# Patient Record
Sex: Male | Born: 1980
Health system: Southern US, Community
[De-identification: ages and names within clinical notes are randomized; demographics above are authoritative.]

---
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
## 2012-01-01 ENCOUNTER — Emergency Department: Payer: Self-pay | Admitting: *Deleted

## 2020-01-01 ENCOUNTER — Other Ambulatory Visit: Payer: Medicaid Other

## 2020-01-01 ENCOUNTER — Other Ambulatory Visit: Payer: Self-pay

## 2020-01-01 DIAGNOSIS — Z20822 Contact with and (suspected) exposure to covid-19: Secondary | ICD-10-CM

## 2020-01-02 LAB — NOVEL CORONAVIRUS, NAA: SARS-CoV-2, NAA: NOT DETECTED

## 2020-01-02 LAB — SARS-COV-2, NAA 2 DAY TAT

## 2020-04-28 ENCOUNTER — Ambulatory Visit (INDEPENDENT_AMBULATORY_CARE_PROVIDER_SITE_OTHER): Payer: Self-pay

## 2020-04-28 ENCOUNTER — Other Ambulatory Visit: Payer: Self-pay

## 2020-04-28 ENCOUNTER — Ambulatory Visit
Admission: EM | Admit: 2020-04-28 | Discharge: 2020-04-28 | Disposition: A | Discharge status: Home / Self Care | Payer: Self-pay | Attending: Internal Medicine | Admitting: Internal Medicine

## 2020-04-28 DIAGNOSIS — K219 Gastro-esophageal reflux disease without esophagitis: Secondary | ICD-10-CM

## 2020-04-28 DIAGNOSIS — F1490 Cocaine use, unspecified, uncomplicated: Secondary | ICD-10-CM

## 2020-04-28 DIAGNOSIS — R0789 Other chest pain: Secondary | ICD-10-CM

## 2020-04-28 DIAGNOSIS — R0602 Shortness of breath: Secondary | ICD-10-CM

## 2020-04-28 DIAGNOSIS — R079 Chest pain, unspecified: Secondary | ICD-10-CM

## 2020-04-28 MED ORDER — OMEPRAZOLE 20 MG PO CPDR: 20.0000 mg | DELAYED_RELEASE_CAPSULE | Freq: Every day | ORAL | 0 refills | Status: AC

## 2020-04-28 NOTE — Discharge Instructions (Addendum)
Your chest xray and EKG are normal. If the chest pain comes back, go to the ER while is happening but call EMS to take you.  Call and ask to get established with a primary care provider. Continue staying off Cocaine and join a support group to help you stay clean.  Since you are a [place], you can check  [facility].  [employer] [phone] mi) [address] [place], [place] [address] Darden Amber [phone] Category: Group [facility] Contact: Joe "Bulldog" Edwards Meeting Time: Monday 6:00 PM  [employer] (13.7 mi) [address], [place], [place] [address] Darden Amber [phone] Category: Group [facility] Contact: Rob Laurine Blazer  [facility]. [address], [place], [place] [address] [phone]

## 2020-04-28 NOTE — ED Provider Notes (Signed)
[facility]    CSN: [id number] Arrival date & time: 04/28/20  1647      History   Chief Complaint Chief Complaint  Patient presents with  . Shortness of Breath    HPI Caleb Crawford is a 39 y.o. male who presents due to having SOB off and on x for several months that comes and goes.  Yesterday he had  SOB and R arm pain and his L chest felt pressure and pain, and felt his heart fluttered. This lasted a few seconds. He stayed SOB after this episodes, but continued working. He has been working physically when this happened.  He started using cocaine again Jan this year 1-2 times a week ( snorting it) but the last time he used it was 2-3 weeks ago, and trying to stay away from old friends whom  he used to use it with, but does not go to a support group.  2- Has been getting a lot of heart burn and he tends to snack before bed time.    History reviewed. No pertinent past medical history.  There are no problems to display for this patient.   Past Surgical History:  Procedure Laterality Date  . PILONIDAL CYST EXCISION  2000       Home Medications    Prior to Admission medications   Medication Sig Start Date End Date Taking? Authorizing Provider  omeprazole (PRILOSEC) 20 MG capsule Take 1 capsule (20 mg total) by mouth daily. 30 min before breakfast 04/28/20   Rodriguez-Southworth, Nettie Elm, PA-C    Family History History reviewed. No pertinent family history.  Social History Social History   Tobacco Use  . Smoking status: Current Every Day Smoker    Packs/day: 2.00    Types: Cigarettes  . Smokeless tobacco: Never Used  Vaping Use  . Vaping Use: Never used  Substance Use Topics  . Alcohol use: Yes    Comment: occasionally  . Drug use: Yes    Types: Marijuana, Cocaine     Allergies   Amoxicillin   Review of Systems Review of Systems  Constitutional: Negative for activity change, appetite change, chills, diaphoresis, fatigue and fever.  HENT:  Positive for congestion. Negative for ear discharge, ear pain, postnasal drip, rhinorrhea, sore throat and voice change.   Eyes: Negative for discharge.  Respiratory: Positive for cough, chest tightness and shortness of breath.   Gastrointestinal: Positive for constipation. Negative for abdominal pain, diarrhea, nausea and vomiting.       + GERD  Musculoskeletal: Positive for back pain. Negative for gait problem.  Skin: Negative for rash.  Neurological: Negative for dizziness, syncope, weakness, numbness and headaches.     Physical Exam Triage Vital Signs ED Triage Vitals  Enc Vitals Group     BP 04/28/20 1726 136/77     Pulse Rate 04/28/20 1726 89     Resp 04/28/20 1726 (!) 22     Temp 04/28/20 1726 98.2 F (36.8 C)     Temp Source 04/28/20 1726 Oral     SpO2 04/28/20 1726 100 %     Weight 04/28/20 1723 170 lb (77.1 kg)     Height 04/28/20 1723 5\' 11"  (1.803 m)     Head Circumference --      Peak Flow --      Pain Score 04/28/20 1723 0     Pain Loc --      Pain Edu? --      Excl. in GC? --  No data found.  Updated Vital Signs BP 136/77 (BP Location: Right Arm)   Pulse 89   Temp 98.2 F (36.8 C) (Oral)   Resp (!) 22   Ht 5\' 11"  (1.803 m)   Wt 170 lb (77.1 kg)   SpO2 100%   BMI 23.71 kg/m   Visual Acuity Right Eye Distance:   Left Eye Distance:   Bilateral Distance:    Right Eye Near:   Left Eye Near:    Bilateral Near:     Physical Exam Physical Exam Vitals signs and nursing note reviewed.  Constitutional:      General: he is not in acute distress.    Appearance: Normal appearance. he is not ill-appearing, toxic-appearing or diaphoretic.  HENT:     Head: Normocephalic.     Right Ear: Tympanic membrane, ear canal and external ear normal.     Left Ear: Tympanic membrane, ear canal and external ear normal.     Nose: Nose normal.     Mouth/Throat:     Mouth: Mucous membranes are moist.  Eyes:     General: No scleral icterus.       Right eye: No  discharge.        Left eye: No discharge.     Conjunctiva/sclera: Conjunctivae normal.  Neck:     Musculoskeletal: Neck supple. No neck rigidity.  Cardiovascular:     Rate and Rhythm: Normal rate and regular rhythm.     Heart sounds: No murmur.  Pulmonary:     Effort: Pulmonary effort is normal. Does not have chest wall tenderness.     Breath sounds: Normal breath sounds.   Musculoskeletal: Normal range of motion.  Lymphadenopathy:     Cervical: No cervical adenopathy.  Skin:    General: Skin is warm and dry.     Coloration: Skin is not jaundiced.     Findings: No rash.  Neurological:     Mental Status: he is alert and oriented to person, place, and time.     Gait: Gait normal.  Psychiatric:        Mood and Affect: Mood normal.        Behavior: Behavior normal.        Thought Content: Thought content normal.        Judgment: Judgment normal.   UC Treatments / Results  Labs (all labs ordered are listed, but only abnormal results are displayed) Labs Reviewed - No data to display  EKG Normal sinus rhythm  Radiology DG Chest 2 View  Result Date: 04/28/2020 CLINICAL DATA:  Chest pain and shortness of breath. EXAM: CHEST - 2 VIEW COMPARISON:  Chest x-ray dated February 02, 2012. FINDINGS: The heart size and mediastinal contours are within normal limits. Both lungs are clear. The visualized skeletal structures are unremarkable. IMPRESSION: No active cardiopulmonary disease. Electronically Signed   By: February 04, 2012 M.D.   On: 04/28/2020 18:34    Procedures Procedures (including critical care time)  Medications Ordered in UC Medications - No data to display  Initial Impression / Assessment and Plan / UC Course  I have reviewed the triage vital signs and the nursing notes. Pertinent labs & imaging results that were available during my care of the patient were reviewed by me and considered in my medical decision making (see chart for details). His chest pain could be from  GERD, but told he needs to get established with a PCP and have further work up. I placed him on Prilosec, and  encouraged him to d/c cocaine and seek support. See instructions.  Final Clinical Impressions(s) / UC Diagnoses   Final diagnoses:  SOB (shortness of breath)  Cocaine use  Other chest pain  Gastroesophageal reflux disease without esophagitis     Discharge Instructions     Your chest xray and EKG are normal. If the chest pain comes back, go to the ER while is happening but call EMS to take you.  Call and ask to get established with a primary care provider. Continue staying off Cocaine and join a support group to help you stay clean.  Since you are a [place], you can check  [facility].  [employer] [phone] mi) [address] [place], [place] [address] Darden Amber [phone] Category: Group Celebrate Recovery Contact: Joe "Bulldog" Edwards Meeting Time: Monday 6:00 PM  [employer] (13.7 mi) [address] [place], [place] [address] Darden Amber [phone] Category: Group [facility] Contact: Rob Laurine Blazer  [facility]. [address] [place], [place] [address] [phone]     ED Prescriptions    Medication Sig Dispense Auth. Provider   omeprazole (PRILOSEC) 20 MG capsule Take 1 capsule (20 mg total) by mouth daily. 30 min before breakfast 30 capsule Rodriguez-Southworth, Nettie Elm, PA-C     PDMP not reviewed this encounter.   Garey Ham, Cordelia Poche 04/28/20 1924

## 2020-04-28 NOTE — ED Triage Notes (Signed)
Patient states that he has been dealing with shortness of breath. Patient states that yesterday he had some arm pain. Patient states that he has been using cocaine recently (last use 2-3 weeks ago) he is concerned this collapsed one of his nasal pathways. Patient states that he has been having acid reflux. Patient states that he has been having some chest pain but is concerned about cancer or DDD.

## 2021-11-19 ENCOUNTER — Emergency Department
Admission: EM | Admit: 2021-11-19 | Discharge: 2021-11-19 | Payer: Medicaid Other | Attending: Emergency Medicine | Admitting: Emergency Medicine

## 2021-11-19 DIAGNOSIS — F1510 Other stimulant abuse, uncomplicated: Secondary | ICD-10-CM

## 2021-11-19 DIAGNOSIS — F1590 Other stimulant use, unspecified, uncomplicated: Secondary | ICD-10-CM | POA: Insufficient documentation

## 2021-11-19 NOTE — ED Notes (Signed)
E-signature pad unavailable - Pt verbalized understanding of D/C information - no additional concerns at this time.  

## 2021-11-19 NOTE — ED Provider Notes (Signed)
[facility] Provider Note    None    (approximate)   History   Medical Clearance   HPI  Caleb Crawford is a 41 y.o. male with history of methamphetamine abuse who presents to the emergency department in police custody for medical clearance for jail.  Patient has no complaints at this time.  Used methamphetamine just prior to being arrested and now is very sleepy but arousable.  He denies any other coingestions.  No opiate or alcohol use.  No thoughts of wanting to harm himself or anyone else.  Denies any pain.   History provided by patient and police.    No past medical history on file.  Past Surgical History:  Procedure Laterality Date   PILONIDAL CYST EXCISION  2000    MEDICATIONS:  Prior to Admission medications   Medication Sig Start Date End Date Taking? Authorizing Provider  omeprazole (PRILOSEC) 20 MG capsule Take 1 capsule (20 mg total) by mouth daily. 30 min before breakfast 04/28/20   Rodriguez-SouthworthNettie Elm, [place]    Physical Exam   Triage Vital Signs: ED Triage Vitals [11/19/21 0437]  Enc Vitals Group     BP 114/78     Pulse Rate 78     Resp 16     Temp 98.4 F (36.9 C)     Temp Source Oral     SpO2 99 %     Weight      Height      Head Circumference      Peak Flow      Pain Score 0     Pain Loc      Pain Edu?      Excl. in GC?     Most recent vital signs: Vitals:   11/19/21 0437  BP: 114/78  Pulse: 78  Resp: 16  Temp: 98.4 F (36.9 C)  SpO2: 99%    CONSTITUTIONAL: Alert and oriented and responds appropriately to questions.  Chronically ill-appearing.  Drowsy but arousable to voice. HEAD: Normocephalic, atraumatic EYES: Conjunctivae clear, pupils appear equal, sclera nonicteric ENT: normal nose; moist mucous membranes NECK: Supple, normal ROM CARD: RRR; S1 and S2 appreciated; no murmurs, no clicks, no rubs, no gallops RESP: Normal chest excursion without splinting or tachypnea; breath sounds clear and  equal bilaterally; no wheezes, no rhonchi, no rales, no hypoxia or respiratory distress, speaking full sentences ABD/GI: Normal bowel sounds; non-distended; soft, non-tender, no rebound, no guarding, no peritoneal signs BACK: The back appears normal EXT: Normal ROM in all joints; no deformity noted, no edema; no cyanosis SKIN: Normal color for age and race; warm; no rash on exposed skin NEURO: Moves all extremities equally, normal speech PSYCH: The patient's mood and manner are appropriate.  Denies SI or HI.   ED Results / Procedures / Treatments   LABS: (all labs ordered are listed, but only abnormal results are displayed) Labs Reviewed - No data to display   EKG:   RADIOLOGY: My personal review and interpretation of imaging:    I have personally reviewed all radiology reports.   No results found.   PROCEDURES:  Critical Care performed: No      Procedures    IMPRESSION / MDM / ASSESSMENT AND PLAN / ED COURSE  I reviewed the triage vital signs and the nursing notes.    Patient here for clearance for jail.  Has no acute complaints.     DIFFERENTIAL DIAGNOSIS (includes but not limited to):   Acute intoxication,  no medical complaints, no psychiatric complaints   Patient's presentation is most consistent with exacerbation of chronic illness.   PLAN: Patient here with history of substance abuse with drowsiness and [employer] department requesting medical clearance before going back to jail.  Patient has no complaints at this time.  Easily arousable to voice.  Monitored for several minutes on pulse oximetry with no apnea or hypoxia.  He denies SI or HI.  Denies any other coingestions.  Denies any pain.  Will discharge in police custody.   MEDICATIONS GIVEN IN ED: Medications - No data to display   ED COURSE:  At this time, I do not feel there is any life-threatening condition present. I reviewed all nursing notes, vitals, pertinent previous records.  All lab and  urine results, EKGs, imaging ordered have been independently reviewed and interpreted by myself.  I reviewed all available radiology reports from any imaging ordered this visit.  Based on my assessment, I feel the patient is safe to be discharged home without further emergent workup and can continue workup as an outpatient as needed. Discussed all findings, treatment plan as well as usual and customary return precautions.  They verbalize understanding and are comfortable with this plan.  Outpatient follow-up has been provided as needed.  All questions have been answered.    CONSULTS:  none   OUTSIDE RECORDS REVIEWED: No old records for review.       FINAL CLINICAL IMPRESSION(S) / ED DIAGNOSES   Final diagnoses:  Methamphetamine use (HCC)     Rx / DC Orders   ED Discharge Orders     None        Note:  This document was prepared using Dragon voice recognition software and may include unintentional dictation errors.   Derk Doubek, Layla Maw, DO 11/19/21 [phone]

## 2021-11-19 NOTE — Discharge Instructions (Signed)
Steps to find a Primary Care Provider (PCP):  Call [phone] or [phone] to access "[facility] Find a Doctor Service."  2.  You may also go on the [facility] website at [email or web address]  

## 2021-11-19 NOTE — ED Triage Notes (Signed)
Pt brought in for medical clearance. Pt states he ingested meth 2 days ago and has not slept since. Pt is in custody. Denies any pain at this time.

## 2022-04-21 DIAGNOSIS — F1910 Other psychoactive substance abuse, uncomplicated: Secondary | ICD-10-CM | POA: Diagnosis not present

## 2022-04-21 DIAGNOSIS — F419 Anxiety disorder, unspecified: Secondary | ICD-10-CM | POA: Diagnosis not present

## 2022-04-21 DIAGNOSIS — Z9189 Other specified personal risk factors, not elsewhere classified: Secondary | ICD-10-CM | POA: Diagnosis not present

## 2022-04-21 DIAGNOSIS — Z1159 Encounter for screening for other viral diseases: Secondary | ICD-10-CM | POA: Diagnosis not present

## 2022-05-13 DIAGNOSIS — Z79899 Other long term (current) drug therapy: Secondary | ICD-10-CM | POA: Diagnosis not present

## 2022-05-18 DIAGNOSIS — Z79899 Other long term (current) drug therapy: Secondary | ICD-10-CM | POA: Diagnosis not present

## 2022-05-30 IMAGING — CR DG CHEST 2V
3 series · 3 of 3 positions shown · non-contrast
Comparison: Chest x-ray dated February 02, 2012.

CLINICAL DATA: Chest pain and shortness of breath.

EXAM:
CHEST - 2 VIEW

[chest pa (1 of 2)]
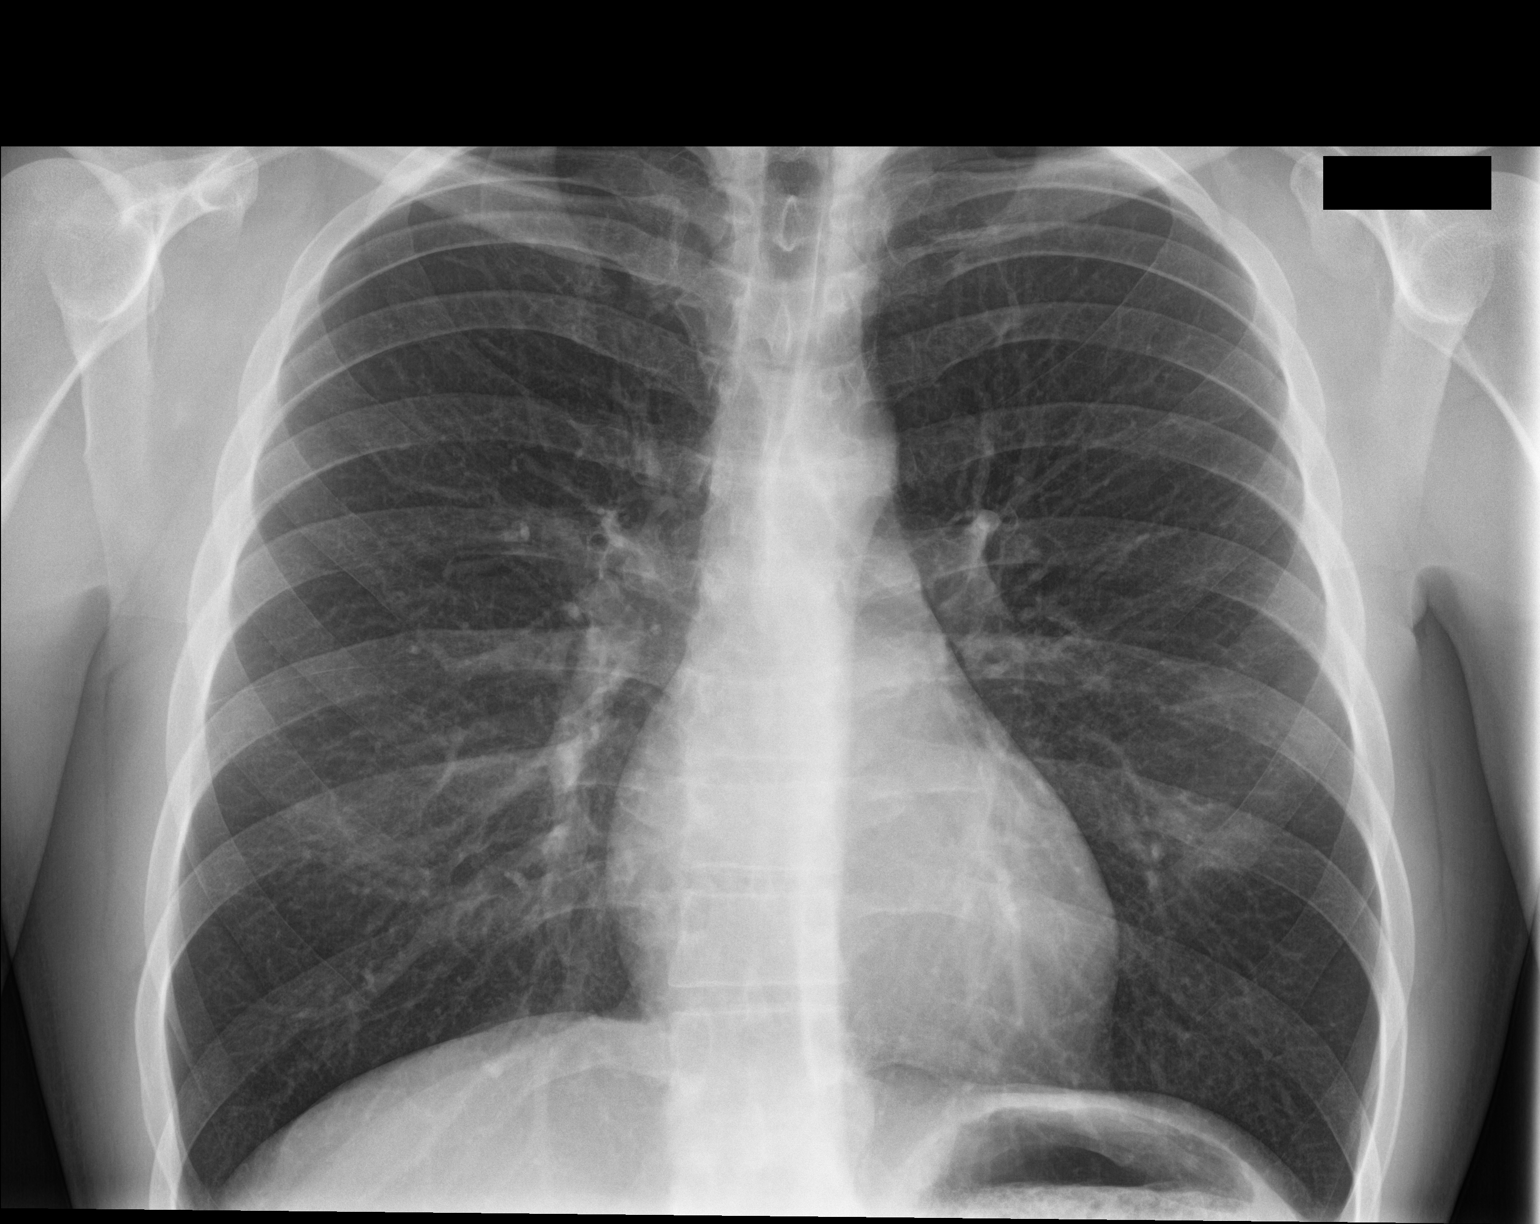

[chest pa (2 of 2)]
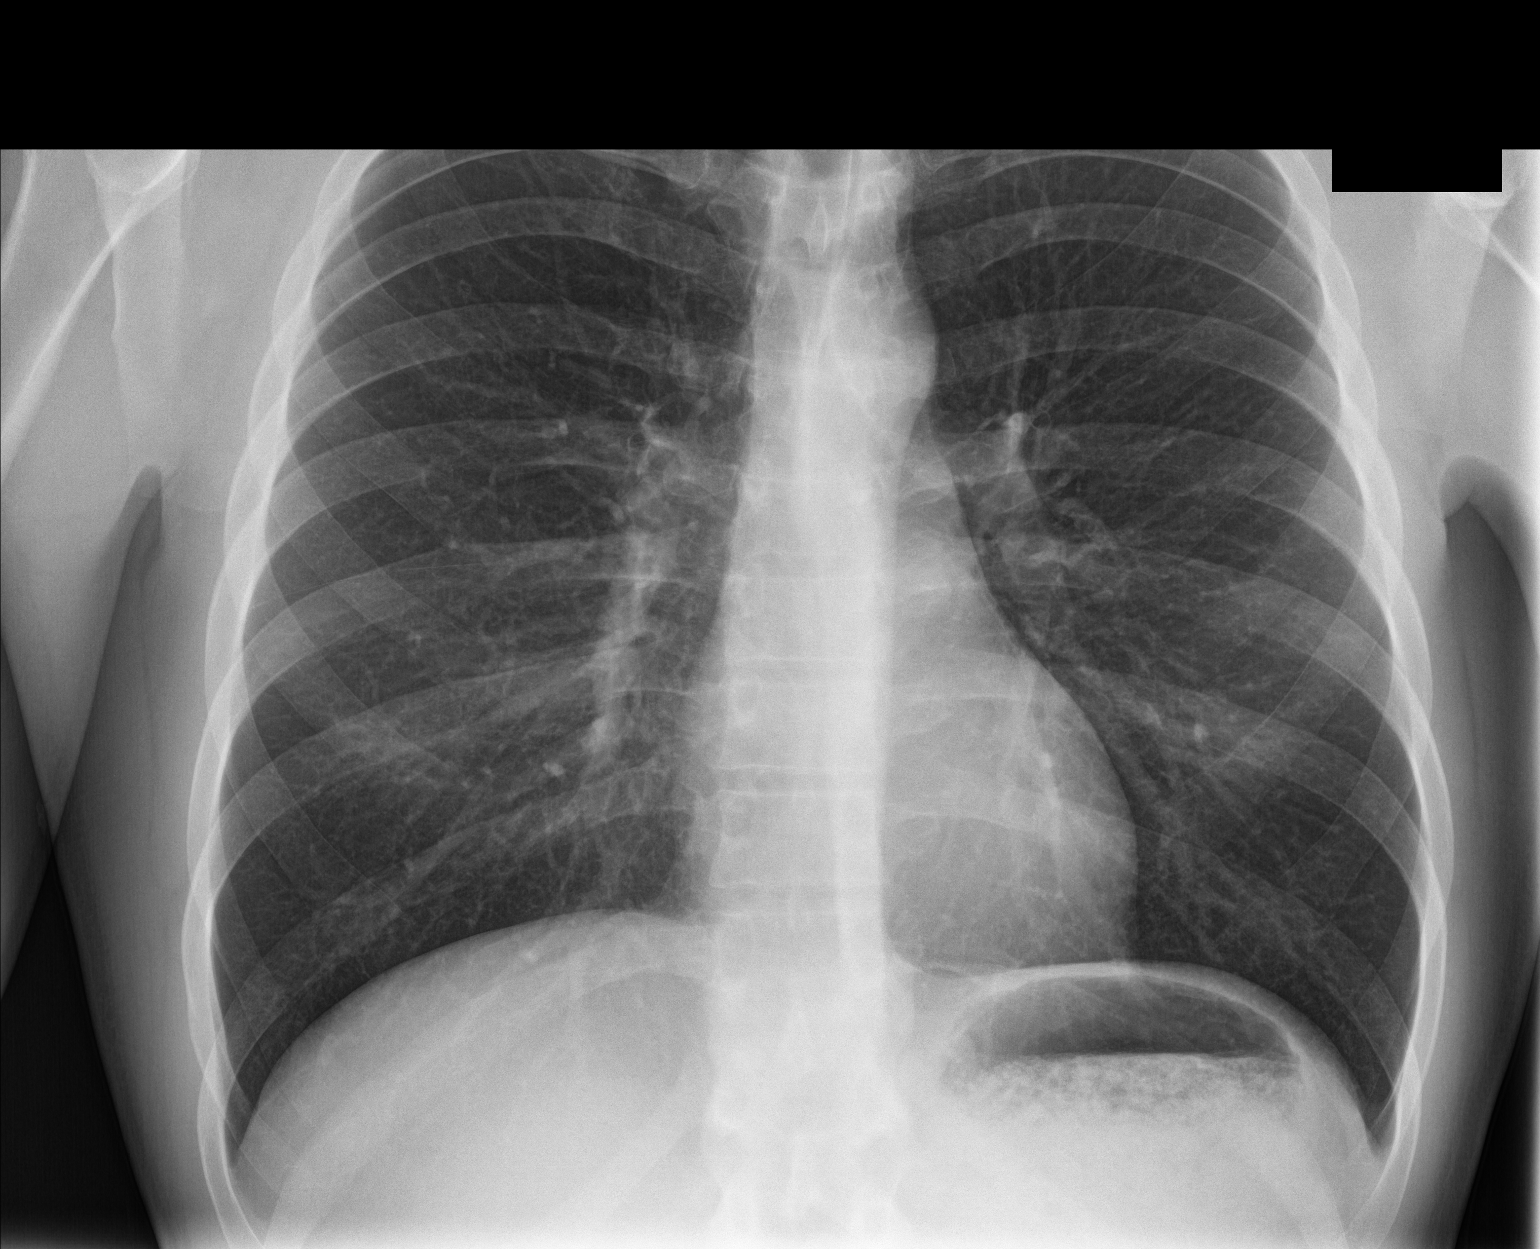

[chest lat]
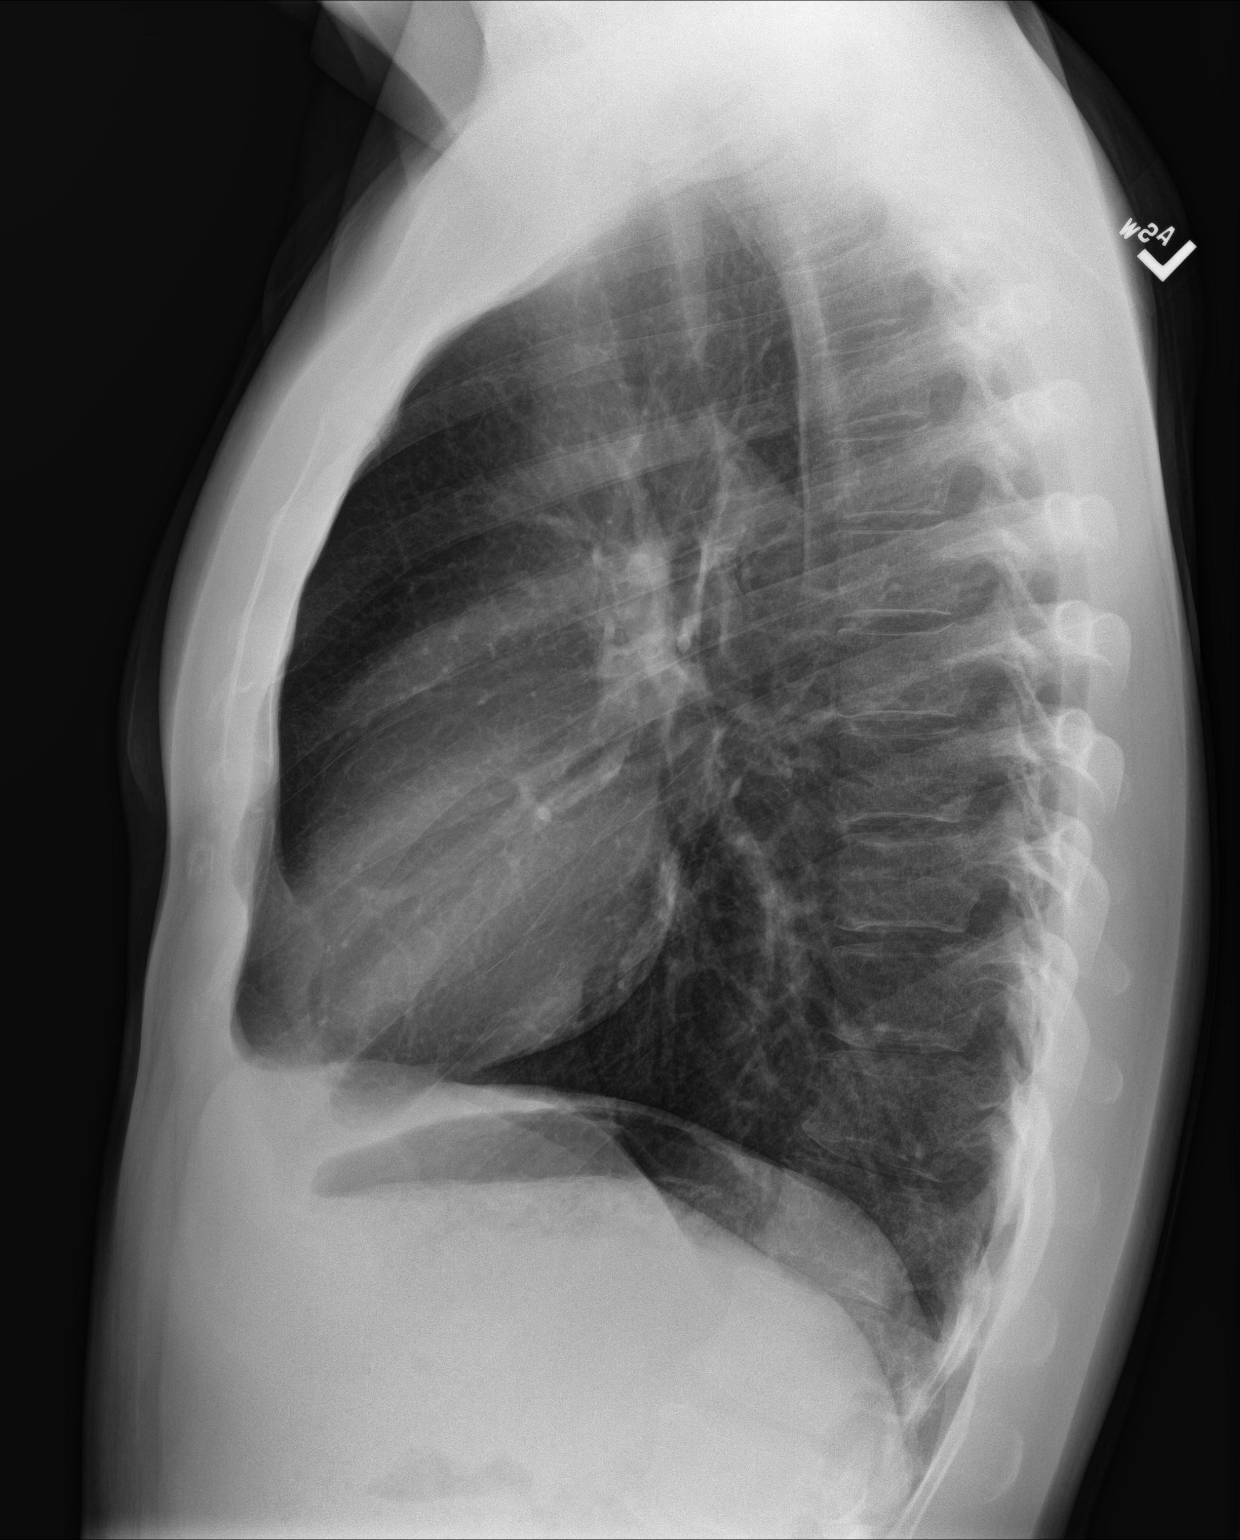

[3 of 3 positions shown; findings below may reference images not displayed]

FINDINGS: The heart size and mediastinal contours are within normal limits.
Both lungs are clear. The visualized skeletal structures are
unremarkable.
IMPRESSION: No active cardiopulmonary disease.

## 2022-06-03 DIAGNOSIS — Z79899 Other long term (current) drug therapy: Secondary | ICD-10-CM | POA: Diagnosis not present

## 2022-06-15 DIAGNOSIS — Z79899 Other long term (current) drug therapy: Secondary | ICD-10-CM | POA: Diagnosis not present

## 2022-06-24 DIAGNOSIS — Z79899 Other long term (current) drug therapy: Secondary | ICD-10-CM | POA: Diagnosis not present

## 2022-07-13 DIAGNOSIS — Z79899 Other long term (current) drug therapy: Secondary | ICD-10-CM | POA: Diagnosis not present

## 2022-07-27 DIAGNOSIS — Z79899 Other long term (current) drug therapy: Secondary | ICD-10-CM | POA: Diagnosis not present

## 2023-07-17 DIAGNOSIS — S299XXA Unspecified injury of thorax, initial encounter: Secondary | ICD-10-CM | POA: Diagnosis not present

## 2023-07-17 DIAGNOSIS — K760 Fatty (change of) liver, not elsewhere classified: Secondary | ICD-10-CM | POA: Diagnosis not present

## 2023-07-17 DIAGNOSIS — S199XXA Unspecified injury of neck, initial encounter: Secondary | ICD-10-CM | POA: Diagnosis not present

## 2023-07-17 DIAGNOSIS — S0990XA Unspecified injury of head, initial encounter: Secondary | ICD-10-CM | POA: Diagnosis not present

## 2023-07-17 DIAGNOSIS — S32401A Unspecified fracture of right acetabulum, initial encounter for closed fracture: Secondary | ICD-10-CM | POA: Diagnosis not present

## 2023-07-17 DIAGNOSIS — S0081XA Abrasion of other part of head, initial encounter: Secondary | ICD-10-CM | POA: Diagnosis not present

## 2023-07-17 DIAGNOSIS — R0602 Shortness of breath: Secondary | ICD-10-CM | POA: Diagnosis not present

## 2023-07-17 DIAGNOSIS — S32421A Displaced fracture of posterior wall of right acetabulum, initial encounter for closed fracture: Secondary | ICD-10-CM | POA: Diagnosis not present

## 2023-07-17 DIAGNOSIS — S3993XA Unspecified injury of pelvis, initial encounter: Secondary | ICD-10-CM | POA: Diagnosis not present

## 2023-07-18 DIAGNOSIS — S8991XA Unspecified injury of right lower leg, initial encounter: Secondary | ICD-10-CM | POA: Diagnosis not present

## 2023-07-18 DIAGNOSIS — S80912A Unspecified superficial injury of left knee, initial encounter: Secondary | ICD-10-CM | POA: Diagnosis not present

## 2023-07-18 DIAGNOSIS — S32421A Displaced fracture of posterior wall of right acetabulum, initial encounter for closed fracture: Secondary | ICD-10-CM | POA: Diagnosis not present

## 2023-07-21 DIAGNOSIS — S32401A Unspecified fracture of right acetabulum, initial encounter for closed fracture: Secondary | ICD-10-CM | POA: Diagnosis not present

## 2023-10-07 DIAGNOSIS — Z87891 Personal history of nicotine dependence: Secondary | ICD-10-CM | POA: Diagnosis not present

## 2023-10-07 DIAGNOSIS — R1310 Dysphagia, unspecified: Secondary | ICD-10-CM | POA: Diagnosis not present

## 2023-10-07 DIAGNOSIS — F419 Anxiety disorder, unspecified: Secondary | ICD-10-CM | POA: Diagnosis not present

## 2023-10-07 DIAGNOSIS — Z79899 Other long term (current) drug therapy: Secondary | ICD-10-CM | POA: Diagnosis not present

## 2023-10-07 DIAGNOSIS — M542 Cervicalgia: Secondary | ICD-10-CM | POA: Diagnosis not present

## 2023-10-07 DIAGNOSIS — K120 Recurrent oral aphthae: Secondary | ICD-10-CM | POA: Diagnosis not present

## 2023-10-07 DIAGNOSIS — K149 Disease of tongue, unspecified: Secondary | ICD-10-CM | POA: Diagnosis not present

## 2023-10-07 DIAGNOSIS — Z880 Allergy status to penicillin: Secondary | ICD-10-CM | POA: Diagnosis not present

## 2023-11-14 DIAGNOSIS — R000 Tachycardia, unspecified: Secondary | ICD-10-CM | POA: Diagnosis not present

## 2023-11-14 DIAGNOSIS — L03115 Cellulitis of right lower limb: Secondary | ICD-10-CM | POA: Diagnosis not present

## 2023-11-14 DIAGNOSIS — M71061 Abscess of bursa, right knee: Secondary | ICD-10-CM | POA: Diagnosis not present

## 2023-11-23 DIAGNOSIS — Z880 Allergy status to penicillin: Secondary | ICD-10-CM | POA: Diagnosis not present

## 2023-11-23 DIAGNOSIS — R1032 Left lower quadrant pain: Secondary | ICD-10-CM | POA: Diagnosis not present

## 2023-11-23 DIAGNOSIS — F17210 Nicotine dependence, cigarettes, uncomplicated: Secondary | ICD-10-CM | POA: Diagnosis not present

## 2023-11-23 DIAGNOSIS — R590 Localized enlarged lymph nodes: Secondary | ICD-10-CM | POA: Diagnosis not present

## 2023-11-23 DIAGNOSIS — Z5189 Encounter for other specified aftercare: Secondary | ICD-10-CM | POA: Diagnosis not present

## 2023-11-23 DIAGNOSIS — Z4801 Encounter for change or removal of surgical wound dressing: Secondary | ICD-10-CM | POA: Diagnosis not present

## 2023-11-23 DIAGNOSIS — R109 Unspecified abdominal pain: Secondary | ICD-10-CM | POA: Diagnosis not present

## 2023-11-23 DIAGNOSIS — R1010 Upper abdominal pain, unspecified: Secondary | ICD-10-CM | POA: Diagnosis not present

## 2023-11-23 DIAGNOSIS — Z79899 Other long term (current) drug therapy: Secondary | ICD-10-CM | POA: Diagnosis not present

## 2023-11-23 DIAGNOSIS — K219 Gastro-esophageal reflux disease without esophagitis: Secondary | ICD-10-CM | POA: Diagnosis not present

## 2023-11-23 DIAGNOSIS — F17290 Nicotine dependence, other tobacco product, uncomplicated: Secondary | ICD-10-CM | POA: Diagnosis not present

## 2023-11-23 DIAGNOSIS — R10814 Left lower quadrant abdominal tenderness: Secondary | ICD-10-CM | POA: Diagnosis not present
